# Patient Record
Sex: Female | Born: 1965 | Race: Black or African American | Marital: Married | Smoking: Current every day smoker
Health system: Southern US, Community
[De-identification: ages and names within clinical notes are randomized; demographics above are authoritative.]

## PROBLEM LIST (undated history)

## (undated) DIAGNOSIS — I10 Essential (primary) hypertension: Secondary | ICD-10-CM

## (undated) HISTORY — PX: ABDOMINAL HYSTERECTOMY: SHX81

---

## 2014-08-15 DIAGNOSIS — I10 Essential (primary) hypertension: Secondary | ICD-10-CM | POA: Insufficient documentation

## 2014-08-28 DIAGNOSIS — R809 Proteinuria, unspecified: Secondary | ICD-10-CM | POA: Insufficient documentation

## 2014-10-09 DIAGNOSIS — B20 Human immunodeficiency virus [HIV] disease: Secondary | ICD-10-CM | POA: Insufficient documentation

## 2014-10-09 DIAGNOSIS — Z21 Asymptomatic human immunodeficiency virus [HIV] infection status: Secondary | ICD-10-CM | POA: Insufficient documentation

## 2015-04-08 ENCOUNTER — Other Ambulatory Visit: Payer: Self-pay | Admitting: Orthopedic Surgery

## 2015-04-08 DIAGNOSIS — M545 Low back pain: Principal | ICD-10-CM

## 2015-04-08 DIAGNOSIS — G8929 Other chronic pain: Secondary | ICD-10-CM

## 2015-04-08 DIAGNOSIS — M5136 Other intervertebral disc degeneration, lumbar region: Secondary | ICD-10-CM

## 2015-04-10 ENCOUNTER — Ambulatory Visit
Admission: RE | Admit: 2015-04-10 | Discharge: 2015-04-10 | Disposition: A | Discharge status: Home / Self Care | Payer: Worker's Compensation | Source: Ambulatory Visit | Attending: Orthopedic Surgery | Admitting: Orthopedic Surgery

## 2015-04-10 DIAGNOSIS — M545 Low back pain, unspecified: Secondary | ICD-10-CM

## 2015-04-10 DIAGNOSIS — M5136 Other intervertebral disc degeneration, lumbar region: Secondary | ICD-10-CM

## 2015-04-10 DIAGNOSIS — G8929 Other chronic pain: Secondary | ICD-10-CM

## 2015-04-10 MED ORDER — IOHEXOL 180 MG/ML  SOLN
1.0000 mL | Freq: Once | INTRAMUSCULAR | Status: AC | PRN
  Administered 2015-04-10: 1 mL via EPIDURAL

## 2015-04-10 MED ORDER — METHYLPREDNISOLONE ACETATE 40 MG/ML INJ SUSP (RADIOLOG
120.0000 mg | Freq: Once | INTRAMUSCULAR | Status: AC
  Administered 2015-04-10: 120 mg via EPIDURAL

## 2015-04-10 NOTE — Discharge Instructions (Signed)

## 2015-07-26 DIAGNOSIS — N183 Chronic kidney disease, stage 3 unspecified: Secondary | ICD-10-CM | POA: Insufficient documentation

## 2015-10-24 ENCOUNTER — Other Ambulatory Visit: Payer: Self-pay | Admitting: Orthopedic Surgery

## 2015-10-24 DIAGNOSIS — M545 Low back pain: Principal | ICD-10-CM

## 2015-10-24 DIAGNOSIS — G8929 Other chronic pain: Secondary | ICD-10-CM

## 2015-10-30 DIAGNOSIS — M5136 Other intervertebral disc degeneration, lumbar region: Secondary | ICD-10-CM | POA: Insufficient documentation

## 2015-10-31 ENCOUNTER — Other Ambulatory Visit: Payer: Worker's Compensation

## 2015-10-31 ENCOUNTER — Ambulatory Visit
Admission: RE | Admit: 2015-10-31 | Discharge: 2015-10-31 | Disposition: A | Discharge status: Home / Self Care | Payer: Worker's Compensation | Source: Ambulatory Visit | Attending: Orthopedic Surgery | Admitting: Orthopedic Surgery

## 2015-10-31 ENCOUNTER — Other Ambulatory Visit: Payer: Self-pay | Admitting: Orthopedic Surgery

## 2015-10-31 VITALS — BP 169/107 | HR 66 | Resp 17 | Ht 67.0 in | Wt 160.0 lb

## 2015-10-31 DIAGNOSIS — M545 Low back pain: Principal | ICD-10-CM

## 2015-10-31 DIAGNOSIS — G8929 Other chronic pain: Secondary | ICD-10-CM

## 2015-10-31 MED ORDER — IOHEXOL 180 MG/ML  SOLN: 4.5000 mL | Freq: Once | INTRAMUSCULAR | Status: DC | PRN

## 2015-10-31 MED ORDER — MEPERIDINE HCL 100 MG/ML IJ SOLN
75.0000 mg | Freq: Once | INTRAMUSCULAR | Status: AC
  Administered 2015-10-31: 75 mg via INTRAMUSCULAR

## 2015-10-31 MED ORDER — SODIUM CHLORIDE 0.9 % IV SOLN
Freq: Once | INTRAVENOUS | Status: AC
  Administered 2015-10-31: 08:00:00 via INTRAVENOUS

## 2015-10-31 MED ORDER — FENTANYL CITRATE (PF) 100 MCG/2ML IJ SOLN
25.0000 ug | INTRAMUSCULAR | Status: DC | PRN
  Administered 2015-10-31: 50 ug via INTRAVENOUS

## 2015-10-31 MED ORDER — CEFAZOLIN SODIUM-DEXTROSE 2-3 GM-% IV SOLR
2.0000 g | Freq: Once | INTRAVENOUS | Status: AC
  Administered 2015-10-31: 2 g via INTRAVENOUS

## 2015-10-31 MED ORDER — MIDAZOLAM HCL 2 MG/2ML IJ SOLN
1.0000 mg | INTRAMUSCULAR | Status: DC | PRN
  Administered 2015-10-31 (×4): 1 mg via INTRAVENOUS

## 2015-10-31 MED ORDER — KETOROLAC TROMETHAMINE 30 MG/ML IJ SOLN
30.0000 mg | Freq: Once | INTRAMUSCULAR | Status: AC
  Administered 2015-10-31: 30 mg via INTRAVENOUS

## 2015-10-31 MED ORDER — ONDANSETRON HCL 4 MG/2ML IJ SOLN
4.0000 mg | Freq: Once | INTRAMUSCULAR | Status: AC
  Administered 2015-10-31: 4 mg via INTRAMUSCULAR

## 2015-10-31 NOTE — Discharge Instructions (Signed)
Discogram Post Procedure Discharge Instructions ° °1. May resume a regular diet and any medications that you routinely take (including pain medications). °2. No driving day of procedure. °3. Upon discharge go home and rest for at least 4 hours.  May use an ice pack as needed to injection sites on back.  Ice to back 30 minutes on and 30 minutes off, all day. °4. May remove bandades later, today. °5. It is not unusual to be sore for several days after this procedure. ° ° ° °Please contact our office at [phone] for the following symptoms: ° °· Fever greater than 100 degrees °· Increased swelling, pain, or redness at injection site. ° ° °Thank you for visiting [facility]. ° ° °

## 2015-10-31 NOTE — Progress Notes (Signed)
Sedation time for discogram is 30 minutes.  jkl

## 2015-11-20 ENCOUNTER — Emergency Department (HOSPITAL_COMMUNITY)
Admission: EM | Admit: 2015-11-20 | Discharge: 2015-11-20 | Disposition: A | Discharge status: Home / Self Care | Payer: Worker's Compensation | Attending: Emergency Medicine | Admitting: Emergency Medicine

## 2015-11-20 ENCOUNTER — Encounter (HOSPITAL_COMMUNITY): Payer: Self-pay | Admitting: *Deleted

## 2015-11-20 ENCOUNTER — Emergency Department (HOSPITAL_COMMUNITY): Payer: Worker's Compensation

## 2015-11-20 DIAGNOSIS — M5136 Other intervertebral disc degeneration, lumbar region: Secondary | ICD-10-CM

## 2015-11-20 DIAGNOSIS — Z79899 Other long term (current) drug therapy: Secondary | ICD-10-CM | POA: Diagnosis not present

## 2015-11-20 DIAGNOSIS — M51369 Other intervertebral disc degeneration, lumbar region without mention of lumbar back pain or lower extremity pain: Secondary | ICD-10-CM

## 2015-11-20 DIAGNOSIS — M5431 Sciatica, right side: Secondary | ICD-10-CM | POA: Diagnosis not present

## 2015-11-20 DIAGNOSIS — F17210 Nicotine dependence, cigarettes, uncomplicated: Secondary | ICD-10-CM | POA: Diagnosis not present

## 2015-11-20 DIAGNOSIS — M545 Low back pain: Secondary | ICD-10-CM | POA: Diagnosis present

## 2015-11-20 DIAGNOSIS — Z791 Long term (current) use of non-steroidal anti-inflammatories (NSAID): Secondary | ICD-10-CM | POA: Diagnosis not present

## 2015-11-20 HISTORY — DX: Essential (primary) hypertension: I10

## 2015-11-20 MED ORDER — PREDNISONE 10 MG PO TABS: 50.0000 mg | ORAL_TABLET | Freq: Every day | ORAL | Status: AC

## 2015-11-20 MED ORDER — OXYCODONE-ACETAMINOPHEN 5-325 MG PO TABS
1.0000 | ORAL_TABLET | Freq: Once | ORAL | Status: AC
  Administered 2015-11-20: 1 via ORAL
  Filled 2015-11-20: qty 1

## 2015-11-20 MED ORDER — HYDROCODONE-ACETAMINOPHEN 5-325 MG PO TABS: 2.0000 | ORAL_TABLET | ORAL | Status: AC | PRN

## 2015-11-20 NOTE — ED Notes (Signed)
Patient was alert, oriented and stable upon discharge. RN went over AVS and patient had no further questions.  

## 2015-11-20 NOTE — Discharge Instructions (Signed)
Degenerative Disk Disease Degenerative disk disease is a condition caused by the changes that occur in spinal disks as you grow older. Spinal disks are soft and compressible disks located between the bones of your spine (vertebrae). These disks act like shock absorbers. Degenerative disk disease can affect the whole spine. However, the neck and lower back are most commonly affected. Many changes can occur in the spinal disks with aging, such as:  The spinal disks may dry and shrink.  Small tears may occur in the tough, outer covering of the disk (annulus).  The disk space may become smaller due to loss of water.  Abnormal growths in the bone (spurs) may occur. This can put pressure on the nerve roots exiting the spinal canal, causing pain.  The spinal canal may become narrowed. RISK FACTORS   Being overweight.  Having a family history of degenerative disk disease.  Smoking.  There is increased risk if you are doing heavy lifting or have a sudden injury. SIGNS AND SYMPTOMS  Symptoms vary from person to person and may include:  Pain that varies in intensity. Some people have no pain, while others have severe pain. The location of the pain depends on the part of your backbone that is affected.  You will have neck or arm pain if a disk in the neck area is affected.  You will have pain in your back, buttocks, or legs if a disk in the lower back is affected.  Pain that becomes worse while bending, reaching up, or with twisting movements.  Pain that may start gradually and then get worse as time passes. It may also start after a major or minor injury.  Numbness or tingling in the arms or legs. DIAGNOSIS  Your health care provider will ask you about your symptoms and about activities or habits that may cause the pain. He or she may also ask about any injuries, diseases, or treatments you have had. Your health care provider will examine you to check for the range of movement that is  possible in the affected area, to check for strength in your extremities, and to check for sensation in the areas of the arms and legs supplied by different nerve roots. You may also have:   An X-ray of the spine.  Other imaging tests, such as MRI. TREATMENT  Your health care provider will advise you on the best plan for treatment. Treatment may include:  Medicines.  Rehabilitation exercises. HOME CARE INSTRUCTIONS   Follow proper lifting and walking techniques as advised by your health care provider.  Maintain good posture.  Exercise regularly as advised by your health care provider.  Perform relaxation exercises.  Change your sitting, standing, and sleeping habits as advised by your health care provider.  Change positions frequently.  Lose weight or maintain a healthy weight as advised by your health care provider.  Do not use any tobacco products, including cigarettes, chewing tobacco, or electronic cigarettes. If you need help quitting, ask your health care provider.  Wear supportive footwear.  Take medicines only as directed by your health care provider. SEEK MEDICAL CARE IF:   Your pain does not go away within 1-4 weeks.  You have significant appetite or weight loss. SEEK IMMEDIATE MEDICAL CARE IF:   Your pain is severe.  You notice weakness in your arms, hands, or legs.  You begin to lose control of your bladder or bowel movements.  You have fevers or night sweats. MAKE SURE YOU:   Understand these  instructions.  Will watch your condition.  Will get help right away if you are not doing well or get worse.   This information is not intended to replace advice given to you by your health care provider. Make sure you discuss any questions you have with your health care provider.   Document Released: 07/26/2007 Document Revised: 10/19/2014 Document Reviewed: 01/30/2014 Elsevier Interactive Patient Education 2016 [employer]  Sciatica Sciatica is pain,  weakness, numbness, or tingling along the path of the sciatic nerve. The nerve starts in the lower back and runs down the back of each leg. The nerve controls the muscles in the lower leg and in the back of the knee, while also providing sensation to the back of the thigh, lower leg, and the sole of your foot. Sciatica is a symptom of another medical condition. For instance, nerve damage or certain conditions, such as a herniated disk or bone spur on the spine, pinch or put pressure on the sciatic nerve. This causes the pain, weakness, or other sensations normally associated with sciatica. Generally, sciatica only affects one side of the body. CAUSES   Herniated or slipped disc.  Degenerative disk disease.  A pain disorder involving the narrow muscle in the buttocks (piriformis syndrome).  Pelvic injury or fracture.  Pregnancy.  Tumor (rare). SYMPTOMS  Symptoms can vary from mild to very severe. The symptoms usually travel from the low back to the buttocks and down the back of the leg. Symptoms can include:  Mild tingling or dull aches in the lower back, leg, or hip.  Numbness in the back of the calf or sole of the foot.  Burning sensations in the lower back, leg, or hip.  Sharp pains in the lower back, leg, or hip.  Leg weakness.  Severe back pain inhibiting movement. These symptoms may get worse with coughing, sneezing, laughing, or prolonged sitting or standing. Also, being overweight may worsen symptoms. DIAGNOSIS  Your caregiver will perform a physical exam to look for common symptoms of sciatica. He or she may ask you to do certain movements or activities that would trigger sciatic nerve pain. Other tests may be performed to find the cause of the sciatica. These may include:  Blood tests.  X-rays.  Imaging tests, such as an MRI or CT scan. TREATMENT  Treatment is directed at the cause of the sciatic pain. Sometimes, treatment is not necessary and the pain and discomfort  goes away on its own. If treatment is needed, your caregiver may suggest:  Over-the-counter medicines to relieve pain.  Prescription medicines, such as anti-inflammatory medicine, muscle relaxants, or narcotics.  Applying heat or ice to the painful area.  Steroid injections to lessen pain, irritation, and inflammation around the nerve.  Reducing activity during periods of pain.  Exercising and stretching to strengthen your abdomen and improve flexibility of your spine. Your caregiver may suggest losing weight if the extra weight makes the back pain worse.  Physical therapy.  Surgery to eliminate what is pressing or pinching the nerve, such as a bone spur or part of a herniated disk. HOME CARE INSTRUCTIONS   Only take over-the-counter or prescription medicines for pain or discomfort as directed by your caregiver.  Apply ice to the affected area for 20 minutes, 3-4 times a day for the first 48-72 hours. Then try heat in the same way.  Exercise, stretch, or perform your usual activities if these do not aggravate your pain.  Attend physical therapy sessions as directed by your  caregiver.  Keep all follow-up appointments as directed by your caregiver.  Do not wear high heels or shoes that do not provide proper support.  Check your mattress to see if it is too soft. A firm mattress may lessen your pain and discomfort. SEEK IMMEDIATE MEDICAL CARE IF:   You lose control of your bowel or bladder (incontinence).  You have increasing weakness in the lower back, pelvis, buttocks, or legs.  You have redness or swelling of your back.  You have a burning sensation when you urinate.  You have pain that gets worse when you lie down or awakens you at night.  Your pain is worse than you have experienced in the past.  Your pain is lasting longer than 4 weeks.  You are suddenly losing weight without reason. MAKE SURE YOU:  Understand these instructions.  Will watch your  condition.  Will get help right away if you are not doing well or get worse.   This information is not intended to replace advice given to you by your health care provider. Make sure you discuss any questions you have with your health care provider.   Follow up with Dr. Marthenia Rolling for re-evaluation. Apply ice to affected area. Return to the emergency department if you experience fever, bowel or bladder incontinence, numbness or tingling in both extremities.

## 2015-11-20 NOTE — Progress Notes (Addendum)
CM spoke with pt who confirms uninsured [employer] resident with no pcp.  CM discussed and provided written information for uninsured accepting pcps, discussed the importance of pcp vs EDP services for f/u care, www.needymeds.org, www.goodrx.com, discounted pharmacies and other [employer] such as [facility] , [facility], affordable care act, financial assistance, uninsured dental services, [place] med assist, DSS and  health department  Reviewed resources for [employer] uninsured accepting pcps like [facility], family medicine at [facility], [facility], [facility], local urgent care centers, [facility], [facility] family practice, general medical clinics, family services of the [place], [facility] urgent care plus others, medication resources, [facility] out patient pharmacies and housing Pt voiced understanding and appreciation of resources provided   Provided [facility] contact information   Entered in d/c instructions Please use the resources provided to you in emergency room by case manager to assist with doctor for follow up Schedule an appointment as soon as possible for a visit As needed These [employer] uninsured resources provide possible primary care providers, resources for discounted medications, housing, dental resour

## 2015-11-20 NOTE — ED Notes (Addendum)
Pt had a fall one year ago at work. She was standing at work today and felt something pop in her lower back. Numbness in the right leg. No incontinence. Pain is a 10/10.Pt given a warm compress to place on her back. Manual BP checked and is 178/98. PA made aware,. Pt denies chest pain or a headache. (2:10pm)

## 2015-11-20 NOTE — ED Provider Notes (Signed)
CSN: [id number]     Arrival date & time 11/20/15  1343 History  By signing my name below, I, Placido Sou, attest that this documentation has been prepared under the direction and in the presence of [employer], PA-C. Electronically Signed: Placido Sou, ED Scribe. 11/20/2015. 2:37 PM.    Chief Complaint  Patient presents with  . Back Pain   The history is provided by the patient. No language interpreter was used.   HPI Comments: Sandra Porter is a 50 y.o. female who presents to the Emergency Department by ambulance complaining of waxing and waning, 10/10, radiating, lower back pain with onset 1 year ago and an exacerbation of her symptoms PTA. Pt works as a Conservation officer, nature, was standing, and felt an abnormal "pop" in her lower back with a sudden onset of her pain which radiates down her left leg. She takes tramadol for pain management which provides little relief. She was ambulatory with difficulty due to pain prior to the incident. Pt has a hx of a fall on 11/19/2014 which she states initially injured her lower back and a CT lumbar spine performed on 10/31/2015 (Dr. Yevette Edwards) showing a severly degenerated L5-S1 disk with no intact annulus. She states her physician does not recommend surgery and she has a reevaluation with him after having a physical abilities assessment on 2/13. Pt reports having steroid injections in her back last summer that provided no relief. She has a PMHx of HTN and takes Norvasc and Benicar QD in the afternoon. She denies incontinence of her bowels or bladder, saddle anesthesia or other associated symptoms at this time.    No past medical history on file. No past surgical history on file. No family history on file. Social History  Substance Use Topics  . Smoking status: Current Every Day Smoker -- 0.75 packs/day for 30 years    Types: Cigarettes  . Smokeless tobacco: Not on file  . Alcohol Use: Not on file   OB History    No data available     Review of  Systems  All other systems reviewed and are negative.   Allergies  Morphine and related  Home Medications   Prior to Admission medications   Medication Sig Start Date End Date Taking? Authorizing Provider  abacavir-dolutegravir-lamiVUDine (TRIUMEQ) 600-50-300 MG tablet Take by mouth. 10/01/15   Historical Provider, MD  amLODipine (NORVASC) 10 MG tablet Take 10 mg by mouth.    Historical Provider, MD  cyclobenzaprine (FLEXERIL) 10 MG tablet Take 10 mg by mouth. 11/21/14   Historical Provider, MD  gabapentin (NEURONTIN) 300 MG capsule Take 300 mg by mouth. 02/19/15   Historical Provider, MD  hydrochlorothiazide (HYDRODIURIL) 25 MG tablet Take 25 mg by mouth.    Historical Provider, MD  ibuprofen (ADVIL,MOTRIN) 800 MG tablet Take 800 mg by mouth.    Historical Provider, MD  lisinopril (PRINIVIL,ZESTRIL) 5 MG tablet Take 5 mg by mouth.    Historical Provider, MD  olmesartan-hydrochlorothiazide (BENICAR HCT) 40-12.5 MG tablet Take by mouth. 10/30/15   Historical Provider, MD   There were no vitals taken for this visit.    Physical Exam  Constitutional: She is oriented to person, place, and time. She appears well-developed and well-nourished. No distress.  HENT:  Head: Normocephalic.  Neck: Normal range of motion. Neck supple.  Pulmonary/Chest: Effort normal.  Musculoskeletal: Normal range of motion. She exhibits tenderness. She exhibits no edema.  No C, T, or L spine tenderness to palpation. R lumbar paraspinal muscle TTP. No obvious  signs of trauma, deformity, infection, step-offs. Lung expansion normal. No scoliosis or kyphosis. Bilateral lower extremity strength 5 out of 5, sensation grossly intact, patellar reflexes 2+, pedal pulses 2+, Refill less than 3 seconds.  Straight leg negative Ambulates without difficulty but is painful.   Neurological: She is alert and oriented to person, place, and time.  Skin: Skin is warm and dry. She is not diaphoretic.  Psychiatric: She has a normal  mood and affect. Her behavior is normal. Judgment and thought content normal.  Nursing note and vitals reviewed.   ED Course  Procedures  DIAGNOSTIC STUDIES: Oxygen Saturation is 100% on RA, normal by my interpretation.    COORDINATION OF CARE: 1:47 PM Discussed next steps with pt including oxycodone and DG lumbar spine. She verbalized understanding and is agreeable with the plan.   Labs Review Labs Reviewed - No data to display  Imaging Review Dg Lumbar Spine Complete  11/20/2015  CLINICAL DATA:  Acute lower back and right lower extremity pain while standing at work today. EXAM: LUMBAR SPINE - COMPLETE 4+ VIEW COMPARISON:  None. FINDINGS: No fracture or spondylolisthesis is noted. Severe degenerative disc disease is noted at L5-S1 with anterior osteophyte formation. Remaining disc spaces appear intact. IMPRESSION: Severe degenerative disc disease is noted at L5-S1. No acute abnormality seen in the lumbar spine. Electronically Signed   By: Lupita Raider, M.D.   On: 11/20/2015 15:07   I have personally reviewed and evaluated these images as part of my medical decision-making.    EKG Interpretation None      MDM   Final diagnoses:  Degenerative disc disease, lumbar  Sciatica of right side    Patient with acute on chronic exacerbation of lower back pain. Pt is followed by Dr. Marthenia Rolling for this. Had recent CT lumbar spine and diskogram performed which revealedseverly degenerated L5-S1 disk with no intact annulus. Per pt, Dr. Verlon Setting feels that she is not a surgical candidate. Pt has been taking home tramadol which is not controlling her pain. No neurological deficits and normal neuro exam. Patient can walk but states is painful. Xray today shows no acute abnormality. No loss of bowel or bladder control.  No concern for cauda equina.  No fever, night sweats, weight loss, h/o cancer, IVDU.  Will d/c home with very short course pain meds and steroids. Pt has follow up appointment with  ortho on 2/13. Return precautions outlined in patient discharge instructions.    I personally performed the services described in this documentation, which was scribed in my presence. The recorded information has been reviewed and is accurate.     [name] [place], PA-C 11/20/15 1930  Raeford Razor, MD 11/21/15 [phone]

## 2017-03-22 IMAGING — RF DG DISKOGRAPHY LUMBAR S+I
7 series · 7 of 7 positions shown · non-contrast
Comparison: none

ADDENDUM:
Conscious sedation time was 30 minutes.

Conscious sedation was supervised by the performing provider, Desha Vivanco
Knoll, Denilson.
CLINICAL DATA: Low back pain. RIGHT leg pain. L5-S1 disc is
suspected to be contributory.
TECHNIQUE: Contiguous axial images were obtained from the inferior aspect of
through . Coronal and sagittal reconstructions of the disc spaces
were created.

[Series 1: (hospital) · 1 of 1 slices shown]
[im 1/1]
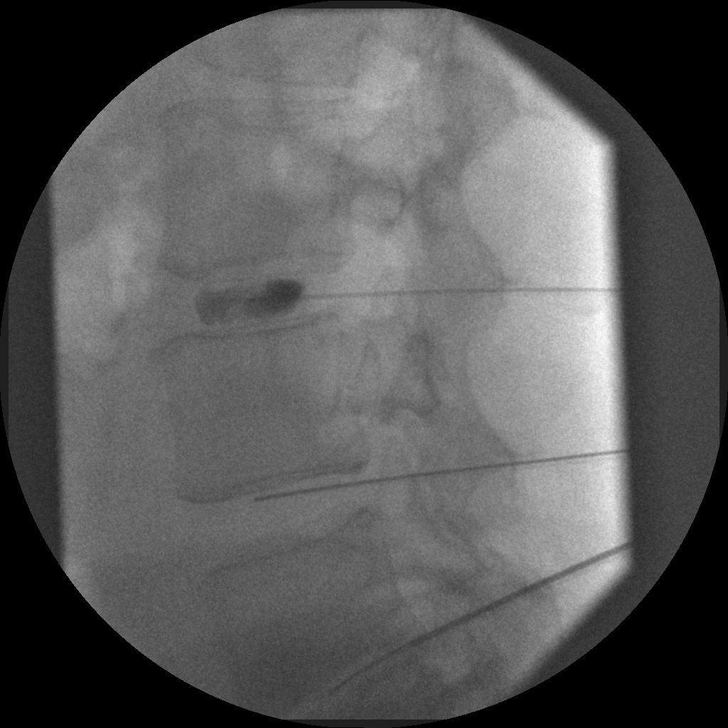

[Series 2: discogram · 1 of 1 slices shown (1 of 6)]
[im 1/1]
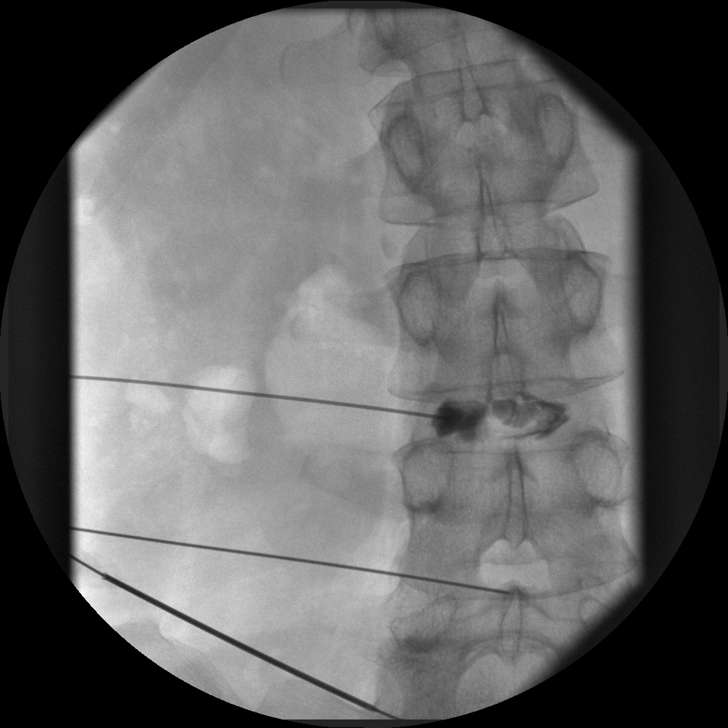

[Series 3: discogram · 1 of 1 slices shown (2 of 6)]
[im 1/1]
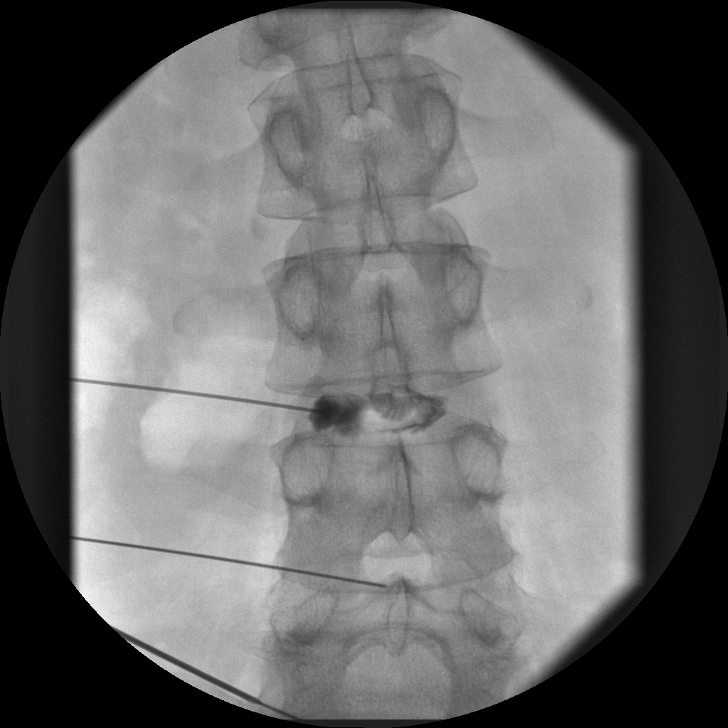

[Series 4: discogram · 1 of 1 slices shown (3 of 6)]
[im 1/1]
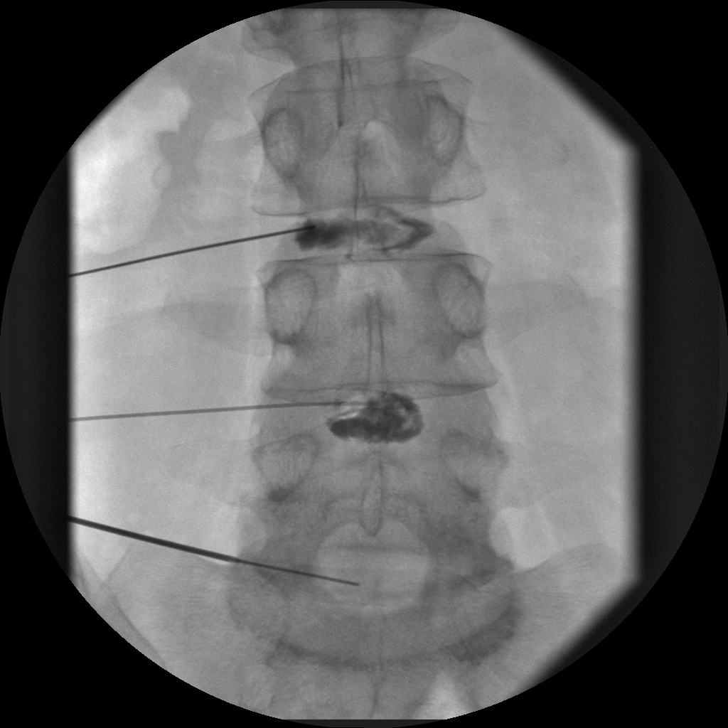

[Series 5: discogram · 1 of 1 slices shown (4 of 6)]
[im 1/1]
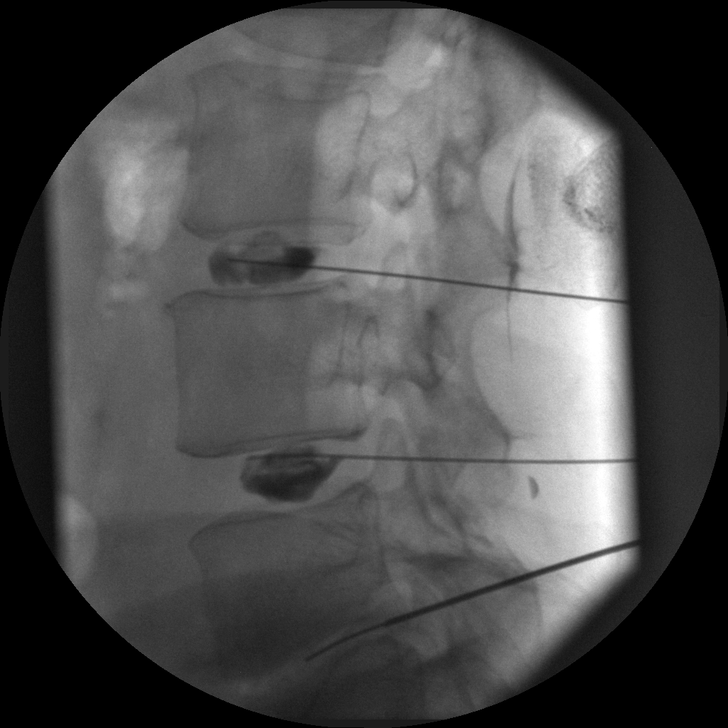

[Series 6: discogram · 1 of 1 slices shown (5 of 6)]
[im 1/1]
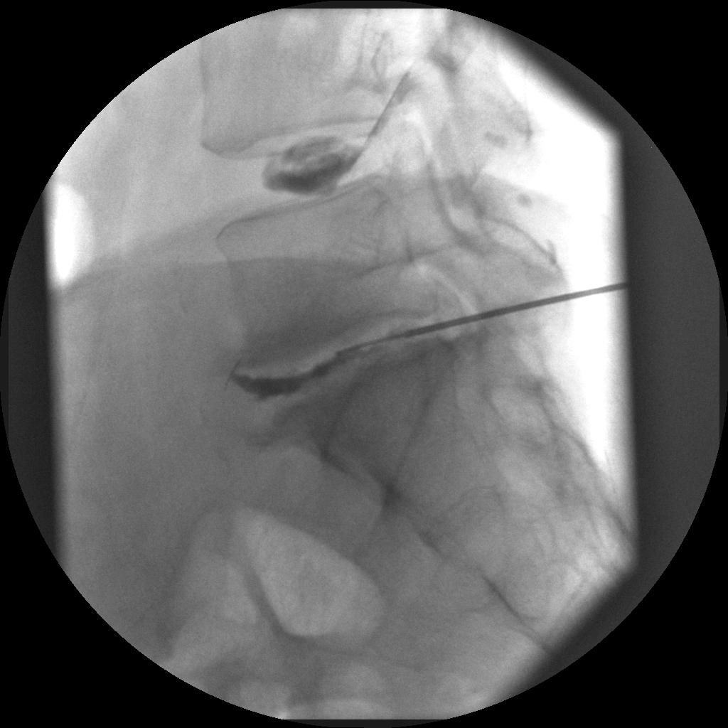

[Series 7: discogram · 1 of 1 slices shown (6 of 6)]
[im 1/1]
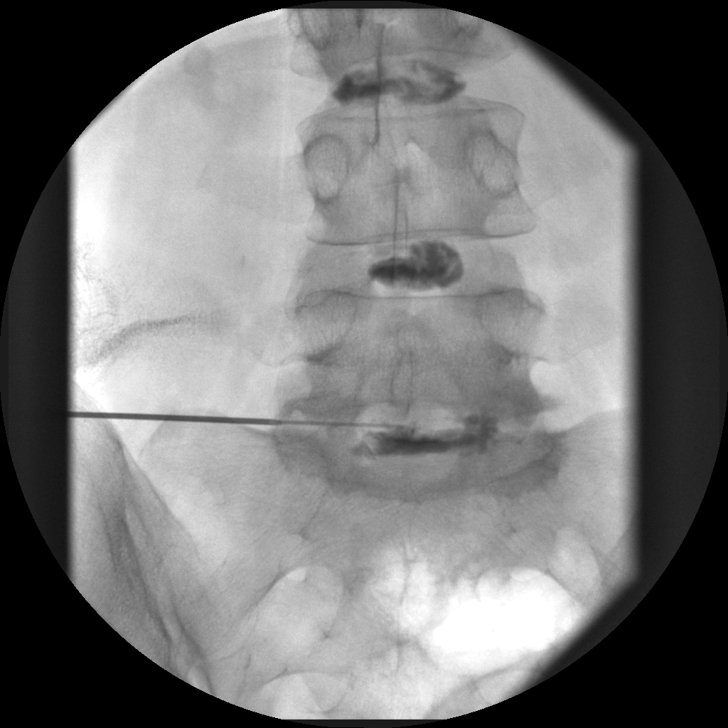

[7 of 7 positions shown; findings below may reference images not displayed]

EXAM:
LUMBAR DISCOGRAM L3-4, L4-5, and L5-S1.

FLUOROSCOPY TIME:  2 minutes and 40 seconds.  Seven spot films.

Conscious sedation time:  30 minutes.

MEDICATIONS:
Versed  4 mg IV.  Fentanyl 50 mcg IV.

PROCEDURE:
After a thorough discussion of risks and benefits of the procedure,
written and oral informed consent was obtained. Specific risks
included discitis, infection of soft tissue and/or bone, and
nontarget injection. Accelerated progression of degenerative disk
disease was also discussed. General risks of the procedure including
bleeding, infection, and injury to nerves, blood vessels, and
adjacent structures. Verbal consent was obtained by Dr. Cinthya prior
to the procedure.

Discography was performed via a LEFT posterior oblique approach.
Prior to prep and draped in the usual sterile fashion, the entry
sites were marked using fluoroscopy. Subsequently, using stringent
sterile technique, the back was prepped and draped. 1 gram IV
Cephazolin as an IV antibiotic was completed 30 minutes prior to
needle stick. 1 mL Antibiotic was also mixed with Kmnipaque-3X0 used
for disc injection.

All elements of maximum barrier sterile technique were employed
(cap, mask, sterile gown and gloves, large sterile sheet, hand
hygeine, betadine scrub or alternative skin antisepsis). After local
anesthesia was provided with 1% lidocaine without epinephrine to the
skin and deeper tissues spinal needles were inserted. Under
meticulous sterile technique, 15, 15, 20 cm 22-gauge needles were
advanced fluoroscopically into the disc spaces of L3-L4 and L4-L5
using intermittent fluoroscopy. One satisfactory needle position was
achieved, contrast was injected.

L3-L4: Total volume injected: 1.5 mL. Opening pressure was 60 PSI.
Pressure endpoint 100 PSI. Pain reported [DATE] (1 - 10 scale).
Sensation described as low back and RIGHT leg pain similar to the
pain at home.. Pattern of opacification showed intact annulus..
Representative images obtained in AP and lateral projections.

L4-L5: Total volume injected: 2.0 mL. Opening pressure was 40 PSI.
Pressure endpoint 100 PSI. Pain reported [DATE] (1 - 10 scale).
Sensation described as back pain, but not typical upper pain at
home.. Pattern of opacification showed intact annulus.
Representative images obtained in AP and lateral projections.

L5-S1: Total volume injected: 1.0 mL. Opening pressure was 20 PSI.
Pressure endpoint 20 PSI. Pain reported [DATE] (1 - 10 scale).
Sensation described as severe concordant back and RIGHT leg pain.
Pattern of opacification showed severe discal degeneration with no
intact annulus. There is posterior spread into the ventral epidural
space. Representative images obtained in AP and lateral projections.

CT POST-DISCOGRAM
FINDINGS: No prevertebral or paraspinous masses. Mild atheromatous change of
the aorta.

L3-L4: Normal.

L4-L5: Normal.

L5-S1: Severe disc space narrowing with endplate sclerosis. No
intact annulus. Posterior extrusion of contrast into the ventral
epidural space, greater on the LEFT. Posterior element hypertrophy.
BILATERAL foraminal narrowing with osseous ridging, worse on the
RIGHT. Anterior osseous ridging into the retroperitoneum is also
observed.
IMPRESSION: LUMBAR DISCOGRAM IMPRESSION:

Concordant [DATE] back and RIGHT leg pain with dynamic disc injection
at L5-S1.

The patient also reports similar [DATE] back and RIGHT leg pain with
dynamic disc provocation at L3-4. The annulus is intact at this
level.

The patient also reports [DATE] back pain, non concordant, with
dynamic disc provocation at L4-5. Intact annulus is also
demonstrated at this level.

CT POST-DISCOGRAM IMPRESSION:

Normal L3-4 and L4-5 discs.

Severely degenerated L5-S1 disc, with no intact annulus. Severe
osseous ridging anteriorly and into both foramina. Posterior
extrusion of contrast into the ventral epidural space.

## 2018-02-03 ENCOUNTER — Emergency Department (HOSPITAL_BASED_OUTPATIENT_CLINIC_OR_DEPARTMENT_OTHER)
Admission: EM | Admit: 2018-02-03 | Discharge: 2018-02-03 | Disposition: A | Discharge status: Home / Self Care | Payer: 59 | Attending: Emergency Medicine | Admitting: Emergency Medicine

## 2018-02-03 ENCOUNTER — Encounter (HOSPITAL_BASED_OUTPATIENT_CLINIC_OR_DEPARTMENT_OTHER): Payer: Self-pay | Admitting: *Deleted

## 2018-02-03 ENCOUNTER — Other Ambulatory Visit: Payer: Self-pay

## 2018-02-03 DIAGNOSIS — S46811A Strain of other muscles, fascia and tendons at shoulder and upper arm level, right arm, initial encounter: Secondary | ICD-10-CM | POA: Diagnosis not present

## 2018-02-03 DIAGNOSIS — I129 Hypertensive chronic kidney disease with stage 1 through stage 4 chronic kidney disease, or unspecified chronic kidney disease: Secondary | ICD-10-CM | POA: Insufficient documentation

## 2018-02-03 DIAGNOSIS — Z79899 Other long term (current) drug therapy: Secondary | ICD-10-CM | POA: Insufficient documentation

## 2018-02-03 DIAGNOSIS — Y929 Unspecified place or not applicable: Secondary | ICD-10-CM | POA: Insufficient documentation

## 2018-02-03 DIAGNOSIS — Y999 Unspecified external cause status: Secondary | ICD-10-CM | POA: Insufficient documentation

## 2018-02-03 DIAGNOSIS — T148XXA Other injury of unspecified body region, initial encounter: Secondary | ICD-10-CM

## 2018-02-03 DIAGNOSIS — Y939 Activity, unspecified: Secondary | ICD-10-CM | POA: Insufficient documentation

## 2018-02-03 DIAGNOSIS — S4991XA Unspecified injury of right shoulder and upper arm, initial encounter: Secondary | ICD-10-CM | POA: Diagnosis present

## 2018-02-03 DIAGNOSIS — F17210 Nicotine dependence, cigarettes, uncomplicated: Secondary | ICD-10-CM | POA: Diagnosis not present

## 2018-02-03 DIAGNOSIS — N183 Chronic kidney disease, stage 3 (moderate): Secondary | ICD-10-CM | POA: Diagnosis not present

## 2018-02-03 MED ORDER — CYCLOBENZAPRINE HCL 10 MG PO TABS
10.0000 mg | ORAL_TABLET | Freq: Every evening | ORAL | 0 refills | Status: AC | PRN
Start: 2018-02-03 — End: ?

## 2018-02-03 MED FILL — CYCLOBENZAPRINE HCL 10 MG T: 10 | 10 days supply | Qty: 10 | Fill #0
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
## 2018-02-03 NOTE — Discharge Instructions (Addendum)
Please read instructions below. Apply ice to your areas of pain for 20 minutes at a time. You can take flexeril every 12 hours as needed for muscle spasm. Be aware this medication can make you drowsy. Schedule an appointment with your primary care provider to follow up on your visit today. Return to the ER for severely worsening headache, vision changes, if new numbness or tingling in your arms or legs, inability to urinate, inability to hold your bowels, or weakness in your extremities.

## 2018-02-03 NOTE — ED Notes (Signed)
ED Provider at bedside. 
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
